# Patient Record
Sex: Male | Born: 1970 | Race: White | Hispanic: No | Marital: Married | Smoking: Current every day smoker
Health system: Southern US, Community
[De-identification: ages and names within clinical notes are randomized; demographics above are authoritative.]

## PROBLEM LIST (undated history)

## (undated) DIAGNOSIS — K219 Gastro-esophageal reflux disease without esophagitis: Secondary | ICD-10-CM

## (undated) DIAGNOSIS — K759 Inflammatory liver disease, unspecified: Secondary | ICD-10-CM

## (undated) DIAGNOSIS — F419 Anxiety disorder, unspecified: Secondary | ICD-10-CM

## (undated) DIAGNOSIS — R768 Other specified abnormal immunological findings in serum: Secondary | ICD-10-CM

## (undated) DIAGNOSIS — F1010 Alcohol abuse, uncomplicated: Secondary | ICD-10-CM

## (undated) DIAGNOSIS — A048 Other specified bacterial intestinal infections: Secondary | ICD-10-CM

## (undated) DIAGNOSIS — I10 Essential (primary) hypertension: Secondary | ICD-10-CM

## (undated) DIAGNOSIS — G4700 Insomnia, unspecified: Secondary | ICD-10-CM

## (undated) DIAGNOSIS — R0602 Shortness of breath: Secondary | ICD-10-CM

## (undated) DIAGNOSIS — R634 Abnormal weight loss: Secondary | ICD-10-CM

## (undated) HISTORY — DX: Anxiety disorder, unspecified: F41.9

## (undated) HISTORY — DX: Gastro-esophageal reflux disease without esophagitis: K21.9

## (undated) HISTORY — PX: TYMPANOSTOMY TUBE PLACEMENT: SHX32

## (undated) HISTORY — DX: Other specified bacterial intestinal infections: A04.8

## (undated) HISTORY — DX: Insomnia, unspecified: G47.00

## (undated) HISTORY — DX: Alcohol abuse, uncomplicated: F10.10

## (undated) HISTORY — DX: Essential (primary) hypertension: I10

## (undated) HISTORY — DX: Other specified abnormal immunological findings in serum: R76.8

---

## 2003-03-07 ENCOUNTER — Emergency Department (HOSPITAL_COMMUNITY): Admission: EM | Admit: 2003-03-07 | Discharge: 2003-03-07 | Payer: Self-pay | Admitting: Emergency Medicine

## 2004-05-22 ENCOUNTER — Emergency Department (HOSPITAL_COMMUNITY): Admission: EM | Admit: 2004-05-22 | Discharge: 2004-05-22 | Payer: Self-pay | Admitting: Emergency Medicine

## 2005-12-06 ENCOUNTER — Emergency Department (HOSPITAL_COMMUNITY): Admission: EM | Admit: 2005-12-06 | Discharge: 2005-12-06 | Payer: Self-pay | Admitting: Emergency Medicine

## 2006-02-07 ENCOUNTER — Emergency Department (HOSPITAL_COMMUNITY): Admission: EM | Admit: 2006-02-07 | Discharge: 2006-02-07 | Payer: Self-pay | Admitting: Emergency Medicine

## 2006-09-25 ENCOUNTER — Emergency Department (HOSPITAL_COMMUNITY): Admission: EM | Admit: 2006-09-25 | Discharge: 2006-09-25 | Payer: Self-pay | Admitting: Emergency Medicine

## 2006-11-29 ENCOUNTER — Ambulatory Visit (HOSPITAL_COMMUNITY): Admission: RE | Admit: 2006-11-29 | Discharge: 2006-11-29 | Payer: Self-pay | Admitting: Family Medicine

## 2008-03-22 ENCOUNTER — Emergency Department (HOSPITAL_COMMUNITY): Admission: EM | Admit: 2008-03-22 | Discharge: 2008-03-22 | Payer: Self-pay | Admitting: Emergency Medicine

## 2008-11-05 IMAGING — CR DG CHEST 1V PORT
1 series · 1 of 1 positions shown · non-contrast
Comparison: 11/29/2006

CLINICAL DATA: Chest pain

PORTABLE CHEST - 1 VIEW

[view not recorded]
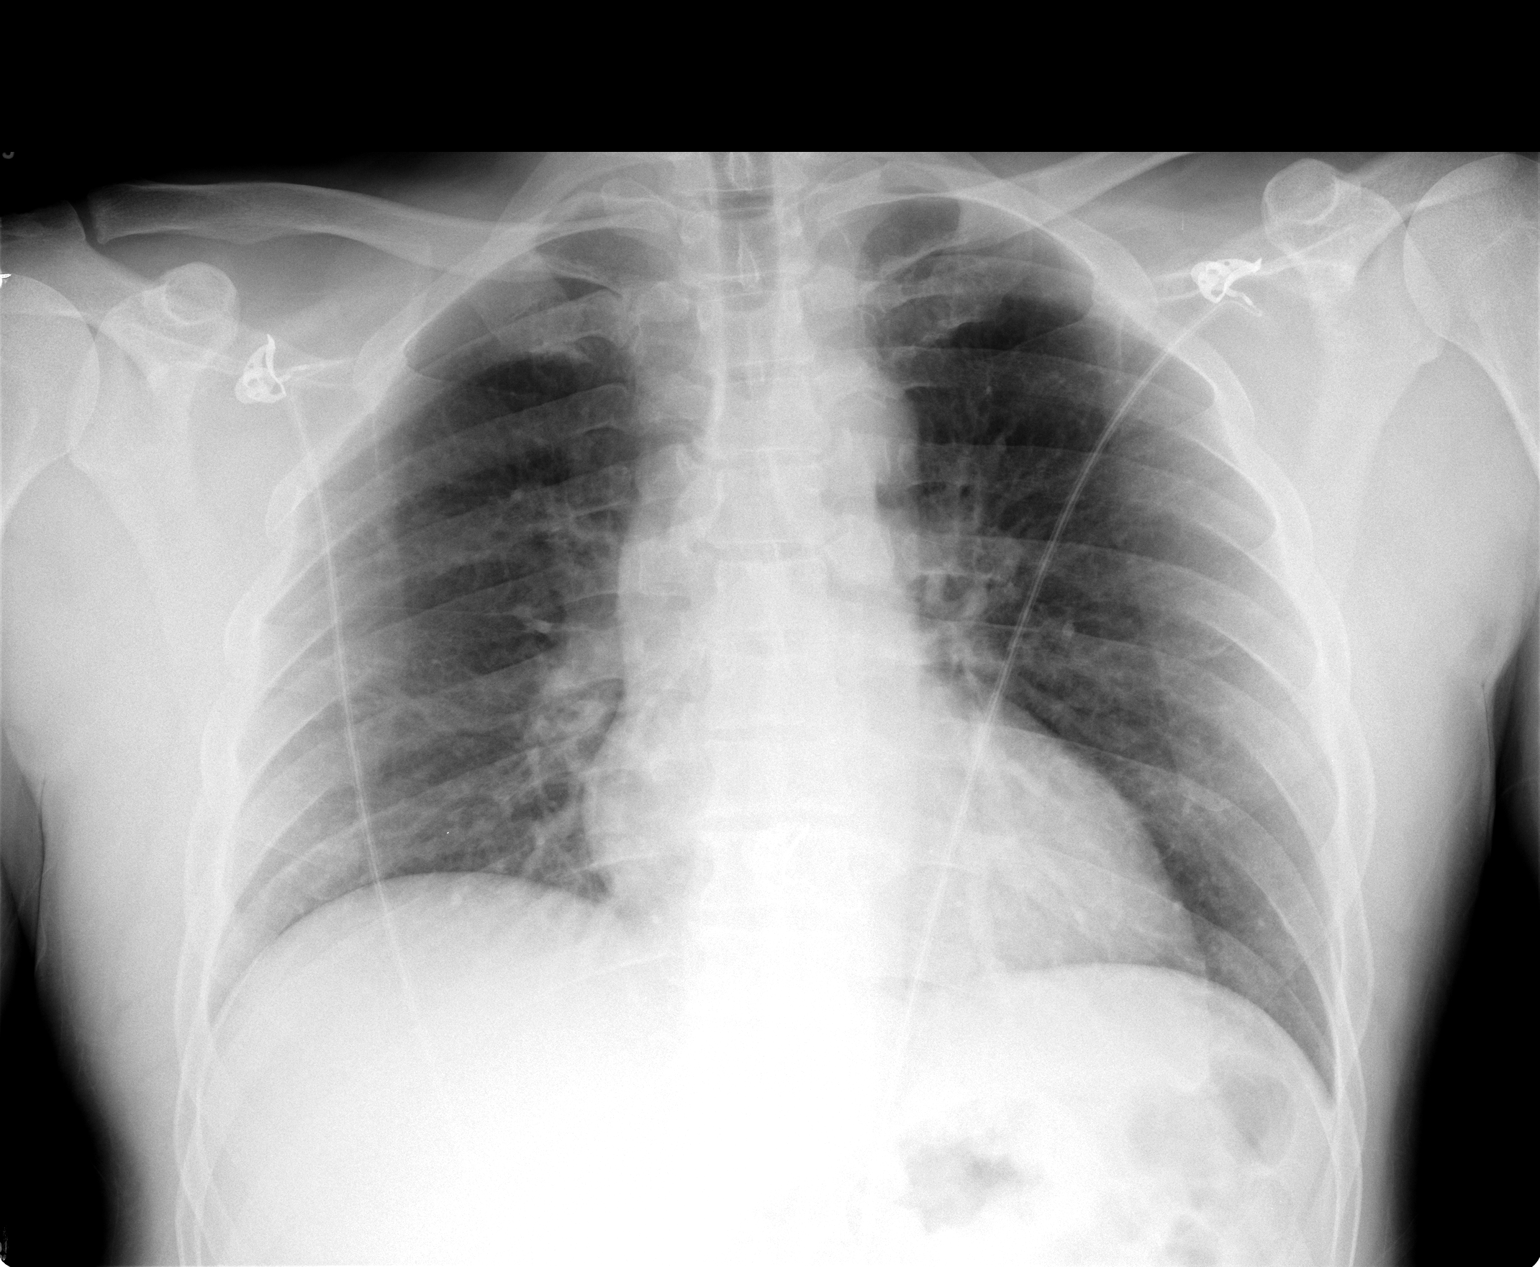

[1 of 1 positions shown; findings below may reference images not displayed]

FINDINGS: Heart size within normal limits for [facility] projection.  Lungs
clear.  No definite heart failure or pleural fluid.
IMPRESSION: No active disease .

## 2011-03-27 NOTE — Consult Note (Signed)
NAMEMarland Kitchen  Jerome Henry, Jerome Henry NO.:  [id number]   MEDICAL RECORD NO.:  [id number]          PATIENT TYPE:  EMS   LOCATION:  ED                            FACILITY:  [facility]   PHYSICIAN:  Donna Bernard, M.D.DATE OF BIRTH:  1971/08/21   DATE OF CONSULTATION:  03/22/2008  DATE OF DISCHARGE:                                 CONSULTATION   CHIEF COMPLAINT:  Chest pain.   HISTORY OF PRESENT ILLNESS:  This patient is a 40 year old male with a  history of hypertension and noncompliance with his meds because of  perceived side effects along with a pack-a-day smoking history who  arrived to the emergency room on the day of admission with high blood  pressure and chest pain; chest pain sharp, worse with certain emotions,  worse with deep breath, transient, hits for just a few seconds.  Blood  pressures have been up.  The patient has received a couple doses of IV  morphine.  He has received IV morphine for this pain and  antihypertensives have been started in the ER.   PHYSICAL EXAMINATION:  VITAL SIGNS: BP 160/90.  LUNGS: Clear.  HEENT: Normal.  ABDOMEN: Soft.  CHEST: Some chest wall tenderness to palpation.   Chest x-ray negative.  EKG negative.  Telemetry negative.  Cardiac  enzymes negative.   IMPRESSION:  1. Chest pain, I doubt cardiac in origin.  2. Hypertension, poor control.   PLAN:  1. Stop other blood pressure meds.  2. Stop smoking.  3. Initiate Norvasc 20 mg daily.  4. Analgesics prescribed for pain, expect gradual resolution, add      antiinflammatories, and also follow up in the office.      Donna Bernard, M.D.  Electronically Signed     WSL/MEDQ  D:  03/24/2008  T:  03/25/2008  Job:  [id number]

## 2013-08-11 ENCOUNTER — Ambulatory Visit (INDEPENDENT_AMBULATORY_CARE_PROVIDER_SITE_OTHER): Payer: Self-pay | Admitting: Family Medicine

## 2013-08-11 ENCOUNTER — Encounter: Payer: Self-pay | Admitting: Family Medicine

## 2013-08-11 VITALS — BP 142/88 | Ht 73.0 in | Wt 190.0 lb

## 2013-08-11 DIAGNOSIS — F1010 Alcohol abuse, uncomplicated: Secondary | ICD-10-CM

## 2013-08-11 DIAGNOSIS — F411 Generalized anxiety disorder: Secondary | ICD-10-CM

## 2013-08-11 DIAGNOSIS — I10 Essential (primary) hypertension: Secondary | ICD-10-CM

## 2013-08-11 MED ORDER — TRIAMTERENE-HCTZ 37.5-25 MG PO CAPS: 1.0000 | ORAL_CAPSULE | ORAL | Status: DC

## 2013-08-11 MED ORDER — ALPRAZOLAM 1 MG PO TABS: ORAL_TABLET | ORAL | Status: DC

## 2013-08-11 NOTE — Progress Notes (Signed)
  Subjective:    Patient ID: Jerome Henry, male    DOB: [birth year], 42 y.o.   MRN: [id number]  HPIHere for a blood pressure check.   Stopped drinking heavily recently.  Positive pain and tend for elbow.  Right elbow pain for several months. Pain getting worse. Taking tylenol.  Patient states he has a tremendous problem with anxiety. Often finds himself drinking alcohol to handle his anxiety. Very resistant to alcohol counseling at this time. Has been through multiple counseling sessions and multiple hospitalizations all without success. Has run out of his blood pressure medicine and realizes he needs to take more. The beta blocker however made him feel bad.    Review of Systems No headache no chest pain no abdominal pain ROS otherwise negative    Objective:   Physical Exam Alert no acute distress HEENT normal. Lungs clear. Heart regular in rhythm. Right elbow tenderness palpation. Blood pressure repeat 146/94.       Assessment & Plan:  Impression hypertension suboptimal #2 chronic significant anxiety severe per patient. #3 right or Prandin bursitis discussed plan Dyazide one each morning rationale discussed. He past lamina 1 mg #60 one half to one twice a day drowsy precautions to not use with alcohol 5 refills check every 6 months diet exercise discussed patient declines counseling. WSL

## 2013-08-12 DIAGNOSIS — F411 Generalized anxiety disorder: Secondary | ICD-10-CM | POA: Insufficient documentation

## 2013-08-12 DIAGNOSIS — I10 Essential (primary) hypertension: Secondary | ICD-10-CM | POA: Insufficient documentation

## 2013-08-12 DIAGNOSIS — F1010 Alcohol abuse, uncomplicated: Secondary | ICD-10-CM | POA: Insufficient documentation

## 2013-12-28 ENCOUNTER — Ambulatory Visit (INDEPENDENT_AMBULATORY_CARE_PROVIDER_SITE_OTHER): Payer: Self-pay | Admitting: Nurse Practitioner

## 2013-12-28 ENCOUNTER — Encounter: Payer: Self-pay | Admitting: Nurse Practitioner

## 2013-12-28 VITALS — BP 138/96 | Ht 73.0 in | Wt 199.0 lb

## 2013-12-28 DIAGNOSIS — K219 Gastro-esophageal reflux disease without esophagitis: Secondary | ICD-10-CM

## 2013-12-28 DIAGNOSIS — S2249XA Multiple fractures of ribs, unspecified side, initial encounter for closed fracture: Secondary | ICD-10-CM

## 2013-12-28 MED ORDER — HYDROCODONE-ACETAMINOPHEN 7.5-325 MG PO TABS: 1.0000 | ORAL_TABLET | ORAL | Status: DC | PRN

## 2013-12-28 MED ORDER — OMEPRAZOLE 20 MG PO CPDR: 20.0000 mg | DELAYED_RELEASE_CAPSULE | Freq: Every day | ORAL | Status: AC

## 2013-12-28 NOTE — Patient Instructions (Signed)

## 2013-12-31 ENCOUNTER — Encounter: Payer: Self-pay | Admitting: Gastroenterology

## 2013-12-31 ENCOUNTER — Encounter: Payer: Self-pay | Admitting: Nurse Practitioner

## 2013-12-31 DIAGNOSIS — K219 Gastro-esophageal reflux disease without esophagitis: Secondary | ICD-10-CM | POA: Insufficient documentation
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
## 2014-01-02 ENCOUNTER — Encounter: Payer: Self-pay | Admitting: *Deleted

## 2014-01-19 ENCOUNTER — Ambulatory Visit: Payer: Self-pay | Admitting: Gastroenterology

## 2014-01-19 ENCOUNTER — Telehealth: Payer: Self-pay | Admitting: Gastroenterology

## 2014-01-19 NOTE — Telephone Encounter (Signed)
Pt was a no show

## 2014-01-19 NOTE — Telephone Encounter (Signed)
Please send letter.

## 2014-01-25 ENCOUNTER — Encounter: Payer: Self-pay | Admitting: Gastroenterology

## 2014-01-25 NOTE — Telephone Encounter (Signed)
MAILED LETTER °

## 2014-02-04 ENCOUNTER — Ambulatory Visit: Payer: BC Managed Care – PPO | Admitting: Family Medicine

## 2014-02-24 ENCOUNTER — Ambulatory Visit (INDEPENDENT_AMBULATORY_CARE_PROVIDER_SITE_OTHER): Payer: BC Managed Care – PPO | Admitting: Gastroenterology

## 2014-02-24 ENCOUNTER — Telehealth: Payer: Self-pay | Admitting: Gastroenterology

## 2014-02-24 ENCOUNTER — Encounter: Payer: Self-pay | Admitting: Gastroenterology

## 2014-02-24 ENCOUNTER — Other Ambulatory Visit: Payer: Self-pay | Admitting: Internal Medicine

## 2014-02-24 VITALS — BP 140/88 | HR 87 | Temp 98.4°F | Ht 73.0 in | Wt 187.0 lb

## 2014-02-24 DIAGNOSIS — F1010 Alcohol abuse, uncomplicated: Secondary | ICD-10-CM

## 2014-02-24 DIAGNOSIS — K760 Fatty (change of) liver, not elsewhere classified: Secondary | ICD-10-CM

## 2014-02-24 DIAGNOSIS — R7689 Other specified abnormal immunological findings in serum: Secondary | ICD-10-CM

## 2014-02-24 DIAGNOSIS — R112 Nausea with vomiting, unspecified: Secondary | ICD-10-CM

## 2014-02-24 DIAGNOSIS — R634 Abnormal weight loss: Secondary | ICD-10-CM

## 2014-02-24 DIAGNOSIS — R894 Abnormal immunological findings in specimens from other organs, systems and tissues: Secondary | ICD-10-CM

## 2014-02-24 DIAGNOSIS — R6889 Other general symptoms and signs: Secondary | ICD-10-CM

## 2014-02-24 DIAGNOSIS — K7689 Other specified diseases of liver: Secondary | ICD-10-CM

## 2014-02-24 DIAGNOSIS — K219 Gastro-esophageal reflux disease without esophagitis: Secondary | ICD-10-CM

## 2014-02-24 DIAGNOSIS — R0989 Other specified symptoms and signs involving the circulatory and respiratory systems: Secondary | ICD-10-CM

## 2014-02-24 DIAGNOSIS — R768 Other specified abnormal immunological findings in serum: Secondary | ICD-10-CM

## 2014-02-24 NOTE — Assessment & Plan Note (Addendum)
43 year old gentleman with history of chronic GERD  which has never been evaluated who presents with multiple GI complaints. Complains of waking up every morning with vomiting. Frequent heartburn, lack of appetite in the setting of daily Goody's powders. He reports 25 pound weight loss in the past 3-4 months. Documented 12 pound weight loss at least since February. Significant history of alcohol abuse daily. Patient is at increased risk of peptic ulcer disease, complicated GERD, malignancy. Recently had noncontrast CT scan which showed fatty liver and rib fractures as noted above. At this point would offer an upper endoscopy with deep sedation in the OR given alcohol abuse/anxiety/polypharmacy.  I have discussed the risks, alternatives, benefits with regards to but not limited to the risk of reaction to medication, bleeding, infection, perforation and the patient is agreeable to proceed. Written consent to be obtained.  Would recommend daily omeprazole. Stop aspirin powders. Dramatically decrease alcohol consumption. Further recommendations to follow. Out of work note provided for today.
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
## 2014-02-24 NOTE — Telephone Encounter (Signed)
EGD w/Propofol w/RMR is scheduled on Thursday May 7th and I have spoken to his wife and they are aware, I have mailed him instructions

## 2014-02-24 NOTE — Assessment & Plan Note (Signed)
Patient has several episodes of significant choking which occurs while awake and nocturnally. Never related to a meal. Describes episodes that last one to 2 minutes, becomes blue in the face, can exhale but not inhale. Question spasm, aspiration. Discussed EGD and initially to make sure no esophageal abnormalities. May need to have speech therapy evaluation or ENT evaluation.

## 2014-02-24 NOTE — Assessment & Plan Note (Signed)
Noted on recent CT. Patient is interesting history of possible chronic hepatitis C detected 14 years ago. Possibly false positive antibody as patient reports being told later that he did not have hepatitis C by the health department. He demonstrates history of high-risk behavior. Will recheck at this time. Also check LFTs. Recommend significantly decreasing alcohol use with goal of quitting.

## 2014-02-24 NOTE — Telephone Encounter (Signed)
Message copied by Ishmael HolterLOVELACE, Brodrick Curran A on Wed Feb 24, 2014  2:17 PM ------      Message from: Tiffany KocherLEWIS, LESLIE S      Created: Wed Feb 24, 2014  1:49 PM       Please let patient know instructions.      Needs labs.      Please schedule EGD in the OR with Dr. Jena Gaussourk, see office note.      Decrease alcohol consumption      Stop aspirin powder      Take omeprazole daily.            These instructions are noted in the AVS, see me if any questions. ------

## 2014-02-24 NOTE — Patient Instructions (Signed)
1. Please have your lab work done. 2. Upper endoscopy, see separate instructions. 3. Take omeprazole every day before breakfast. 4. Stop aspirin powders. 5. You need to drop back significantly on your alcohol consumption. If you need any assistance please let us know.

## 2014-02-25 ENCOUNTER — Ambulatory Visit: Payer: Self-pay | Admitting: Gastroenterology

## 2014-02-25 ENCOUNTER — Other Ambulatory Visit: Payer: Self-pay | Admitting: Family Medicine

## 2014-02-25 LAB — COMPREHENSIVE METABOLIC PANEL
ALBUMIN: 4.3 g/dL (ref 3.5–5.2)
ALT: 45 U/L (ref 0–53)
AST: 60 U/L — ABNORMAL HIGH (ref 0–37)
Alkaline Phosphatase: 74 U/L (ref 39–117)
BILIRUBIN TOTAL: 0.8 mg/dL (ref 0.2–1.2)
BUN: 8 mg/dL (ref 6–23)
CALCIUM: 9.4 mg/dL (ref 8.4–10.5)
CHLORIDE: 102 meq/L (ref 96–112)
CO2: 27 meq/L (ref 19–32)
Creat: 0.85 mg/dL (ref 0.50–1.35)
Glucose, Bld: 110 mg/dL — ABNORMAL HIGH (ref 70–99)
POTASSIUM: 3.9 meq/L (ref 3.5–5.3)
Sodium: 137 mEq/L (ref 135–145)
TOTAL PROTEIN: 7.3 g/dL (ref 6.0–8.3)

## 2014-02-25 LAB — CBC WITH DIFFERENTIAL/PLATELET
BASOS PCT: 0 % (ref 0–1)
Basophils Absolute: 0 10*3/uL (ref 0.0–0.1)
EOS PCT: 1 % (ref 0–5)
Eosinophils Absolute: 0.1 10*3/uL (ref 0.0–0.7)
HCT: 43.3 % (ref 39.0–52.0)
HEMOGLOBIN: 15.8 g/dL (ref 13.0–17.0)
LYMPHS PCT: 35 % (ref 12–46)
Lymphs Abs: 1.9 10*3/uL (ref 0.7–4.0)
MCH: 34.1 pg — AB (ref 26.0–34.0)
MCHC: 36.5 g/dL — ABNORMAL HIGH (ref 30.0–36.0)
MCV: 93.5 fL (ref 78.0–100.0)
MONO ABS: 0.7 10*3/uL (ref 0.1–1.0)
MONOS PCT: 13 % — AB (ref 3–12)
NEUTROS ABS: 2.7 10*3/uL (ref 1.7–7.7)
Neutrophils Relative %: 51 % (ref 43–77)
Platelets: 119 10*3/uL — ABNORMAL LOW (ref 150–400)
RBC: 4.63 MIL/uL (ref 4.22–5.81)
RDW: 13.3 % (ref 11.5–15.5)
WBC: 5.3 10*3/uL (ref 4.0–10.5)

## 2014-02-25 LAB — LIPASE: LIPASE: 25 U/L (ref 0–75)

## 2014-02-25 NOTE — Telephone Encounter (Signed)
It is best for this medication to be used sparingly. I would recommend this be refilled for 20 tablets with no refills. The prescription should read half to a whole tablet twice daily as necessary for severe anxiety. Use sparingly. Not meant for frequent use.

## 2014-02-25 NOTE — Progress Notes (Signed)
cc'd to pcp 

## 2014-03-01 LAB — HCV RNA QUANT RFLX ULTRA OR GENOTYP
HCV Quantitative Log: 5.24 {Log} — ABNORMAL HIGH (ref <1.18)
HCV Quantitative: 174272 IU/mL — ABNORMAL HIGH (ref <15)

## 2014-03-03 ENCOUNTER — Encounter (HOSPITAL_COMMUNITY): Payer: Self-pay

## 2014-03-03 LAB — HEPATITIS C GENOTYPE: HCV Genotype: 1

## 2014-03-08 ENCOUNTER — Ambulatory Visit (INDEPENDENT_AMBULATORY_CARE_PROVIDER_SITE_OTHER): Payer: BC Managed Care – PPO | Admitting: Family Medicine

## 2014-03-08 ENCOUNTER — Telehealth: Payer: Self-pay | Admitting: Family Medicine

## 2014-03-08 ENCOUNTER — Encounter: Payer: Self-pay | Admitting: Family Medicine

## 2014-03-08 VITALS — BP 140/90 | Ht 72.0 in | Wt 193.2 lb

## 2014-03-08 DIAGNOSIS — B1920 Unspecified viral hepatitis C without hepatic coma: Secondary | ICD-10-CM

## 2014-03-08 DIAGNOSIS — Z0189 Encounter for other specified special examinations: Secondary | ICD-10-CM

## 2014-03-08 DIAGNOSIS — B182 Chronic viral hepatitis C: Secondary | ICD-10-CM | POA: Insufficient documentation

## 2014-03-08 MED ORDER — ALPRAZOLAM 1 MG PO TABS: ORAL_TABLET | ORAL | Status: DC

## 2014-03-08 NOTE — Patient Instructions (Signed)
Hepatitis C °Hepatitis C is a viral infection of the liver. Infection may go undetected for months or years because symptoms may be absent or very mild. Chronic liver disease is the main danger of hepatitis C. This may lead to scarring of the liver (cirrhosis), liver failure, and liver cancer. °CAUSES  °Hepatitis C is caused by the hepatitis C virus (HCV). Formerly, hepatitis C infections were most commonly transmitted through blood transfusions. In the early 1990s, routine testing of donated blood for hepatitis C and exclusion of blood that tests positive for HCV began. Now, HCV is most commonly transmitted from person to person through injection drug use, sharing needles, or sex with an infected person. A caregiver may also get the infection from exposure to the blood of an infected patient by way of a cut or needle stick.  °SYMPTOMS  °Acute Phase °Many cases of acute HCV infection are mild and cause few problems. Some people may not even realize they are sick. Symptoms in others may last a few weeks to several months and include: °· Feeling very tired. °· Loss of appetite. °· Nausea. °· Vomiting. °· Abdominal pain. °· Dark yellow urine. °· Yellow skin and eyes (jaundice). °· Itching of the skin. °Chronic Phase °· Between 50% to 85% of people who get HCV infection become "chronic carriers." They often have no symptoms, but the virus stays in their body. They may spread the virus to others and can get long-term liver disease. °· Many people with chronic HCV infection remain healthy for many years. However, up to 1 in 5 chronically infected people may develop severe liver diseases including scarring of the liver (cirrhosis), liver failure, or liver cancer. °DIAGNOSIS  °Diagnosis of hepatitis C infection is made by testing blood for the presence of hepatitis C viral particles called RNA. Other tests may also be done to measure the status of current liver function, exclude other liver problems, or assess liver  damage. °TREATMENT  °Treatment with many antiviral drugs is available and recommended for some patients with chronic HCV infection. Drug treatment is generally considered appropriate for patients who: °· Are 18 years of age or older. °· Have a positive test for HCV particles in the blood. °· Have a liver tissue sample (biopsy) that shows chronic hepatitis and significant scarring (fibrosis). °· Do not have signs of liver failure. °· Have acceptable blood test results that confirm the wellness of other body organs. °· Are willing to be treated and conform to treatment requirements. °· Have no other circumstances that would prevent treatment from being recommended (contraindications). °All people who are offered and choose to receive drug treatment must understand that careful medical follow up for many months and even years is crucial in order to make successful care possible. The goal of drug treatment is to eliminate any evidence of HCV in the blood on a long-term basis. This is called a "sustained virologic response" or SVR. Achieving a SVR is associated with a decrease in the chance of life-threatening liver problems, need for a liver transplant, liver cancer rates, and liver-related complications. °Successful treatment currently requires taking treatment drugs for at least 24 weeks and up to 72 weeks. An injected drug (interferon) given weekly and an oral antiviral medicine taken daily are usually prescribed. Side effects from these drugs are common and some may be very serious. Your response to treatment must be carefully monitored by both you and your caregiver throughout the entire treatment period. °PREVENTION °There is no vaccine for hepatitis C. The only   way to prevent the disease is to reduce the risk of exposure to the virus.  °· Avoid sharing drug needles or personal items like toothbrushes, razors, and nail clippers with an infected person. °· Healthcare workers need to avoid injuries and wear  appropriate protective equipment such as gloves, gowns, and face masks when performing invasive medical or nursing procedures. °HOME CARE INSTRUCTIONS  °To avoid making your liver disease worse: °· Strictly avoid drinking alcohol. °· Carefully review all new prescriptions of medicines with your caregiver. Ask your caregiver which drugs you should avoid. The following drugs are toxic to the liver, and your caregiver may tell you to avoid them: °· Isoniazid. °· Methyldopa. °· Acetaminophen. °· Anabolic steroids (muscle-building drugs). °· Erythromycin. °· Oral contraceptives (birth control pills). °· Check with your caregiver to make sure medicine you are currently taking will not be harmful. °· Periodic blood tests may be required. Follow your caregiver's advice about when you should have blood tests. °· Avoid a sexual relationship until advised otherwise by your caregiver. °· Avoid activities that could expose other people to your blood. Examples include sharing a toothbrush, nail clippers, razors, and needles. °· Bed rest is not necessary, but it may make you feel better. Recovery time is not related to the amount of rest you receive. °· This infection is contagious. Follow your caregiver's instructions in order to avoid spread of the infection. °SEEK IMMEDIATE MEDICAL CARE IF: °· You have increasing fatigue or weakness. °· You have an oral temperature above 102° F (38.9° C), not controlled by medicine. °· You develop loss of appetite, nausea, or vomiting. °· You develop jaundice. °· You develop easy bruising or bleeding. °· You develop any severe problems as a result of your treatment. °MAKE SURE YOU:  °· Understand these instructions. °· Will watch your condition. °· Will get help right away if you are not doing well or get worse. °Document Released: 10/26/2000 Document Revised: 01/21/2012 Document Reviewed: 02/28/2011 °Exit[employer]® Patient Information ©2014 [employer]. ° °
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
## 2014-03-08 NOTE — Telephone Encounter (Signed)
No, only sexual partners

## 2014-03-08 NOTE — Telephone Encounter (Signed)
Does her son need to be tested for this now?

## 2014-03-09 LAB — LIPID PANEL
CHOL/HDL RATIO: 3.2 ratio
CHOLESTEROL: 174 mg/dL (ref 0–200)
HDL: 54 mg/dL (ref >39)
LDL Cholesterol: 100 mg/dL — ABNORMAL HIGH (ref 0–99)
Triglycerides: 102 mg/dL (ref <150)
VLDL: 20 mg/dL (ref 0–40)

## 2014-03-10 ENCOUNTER — Telehealth: Payer: Self-pay | Admitting: *Deleted

## 2014-03-10 NOTE — Progress Notes (Signed)
Quick Note:  Mild elevation of AST. Platelet count slightly low. Labs show active Hepatitis C.  His last liver imaging was noncontrast, which limits study but no obvious tumor or cirrhosis.   Proceed with EGD. Based on findings, we will determine what further liver imaging he will need. We can also discuss possibility of HCV treatment at upcoming OV. Schedule a follow up OV in 6 weeks. ______

## 2014-03-10 NOTE — Telephone Encounter (Signed)
I spoke with Jerome Henry. Advised her that I would like to speak with patient regarding results. They are already aware of HCV, heard from PCP.   Patient to call me back 03/11/14 during lunch time.

## 2014-03-10 NOTE — Patient Instructions (Signed)
Jerome Henry  03/10/2014   Your procedure is scheduled on:  Thursday, Mar 18, 2014  [facility] at [facility] AM.  Call this number if you have problems the morning of surgery: [phone]   Remember:   Do not eat food or drink liquids after midnight.   Take these medicines the morning of surgery with A SIP OF WATER: xanax and prilosec   Do not wear jewelry, make-up or nail polish.  Do not wear lotions, powders, or perfumes. You may wear deodorant.  Do not shave 48 hours prior to surgery. Men may shave face and neck.  Do not bring valuables to the hospital.  [facility] not responsible                  for any belongings or valuables.               Contacts, dentures or bridgework may not be worn into surgery.  Leave suitcase in the car. After surgery it may be brought to your room.  For patients admitted to the hospital, discharge time is determined by your                treatment team.               Patients discharged the day of surgery will not be allowed to drive  home.  Name and phone number of your driver: family  Special Instructions: Please follow diet instructions given to you by DR. Rourk's office   Please read over the following fact sheets that you were given: Anesthesia Post-op Instructions and Care and Recovery After Surgery   PATIENT INSTRUCTIONS POST-ANESTHESIA  IMMEDIATELY FOLLOWING SURGERY:  Do not drive or operate machinery for the first twenty four hours after surgery.  Do not make any important decisions for twenty four hours after surgery or while taking narcotic pain medications or sedatives.  If you develop intractable nausea and vomiting or a severe headache please notify your doctor immediately.  FOLLOW-UP:  Please make an appointment with your surgeon as instructed. You do not need to follow up with anesthesia unless specifically instructed to do so.  WOUND CARE INSTRUCTIONS (if applicable):  Keep a dry clean dressing on the anesthesia/puncture  wound site if there is drainage.  Once the wound has quit draining you may leave it open to air.  Generally you should leave the bandage intact for twenty four hours unless there is drainage.  If the epidural site drains for more than 36-48 hours please call the anesthesia department.  QUESTIONS?:  Please feel free to call your physician or the hospital operator if you have any questions, and they will be happy to assist you.

## 2014-03-10 NOTE — Telephone Encounter (Signed)
Lab results are in epic. Routing to LSL for results.

## 2014-03-10 NOTE — Telephone Encounter (Signed)
Pt's other Jerome Henry called for pt stated pt wants to know about blood work because Dr. Gerda DissLuking does not have pt's blood work by Dr. Jena Gaussourk, pt could not have full physical because of it. Please advise [phone]

## 2014-03-10 NOTE — Telephone Encounter (Signed)
Jerome Henry was returning a call for pt. Please advise [phone]

## 2014-03-10 NOTE — Telephone Encounter (Signed)
Tried to call patient. A lady answered and said he was not available right now. I asked her to have him call us and she hung up the phone.   Please try to reach patient. I would advise to give this information to patient only.

## 2014-03-11 ENCOUNTER — Encounter (HOSPITAL_COMMUNITY): Payer: Self-pay

## 2014-03-11 ENCOUNTER — Encounter (HOSPITAL_COMMUNITY)
Admission: RE | Admit: 2014-03-11 | Discharge: 2014-03-11 | Disposition: A | Discharge status: Home / Self Care | Payer: BC Managed Care – PPO | Source: Ambulatory Visit | Attending: Internal Medicine | Admitting: Internal Medicine

## 2014-03-11 ENCOUNTER — Other Ambulatory Visit: Payer: Self-pay

## 2014-03-11 DIAGNOSIS — Z01810 Encounter for preprocedural cardiovascular examination: Secondary | ICD-10-CM | POA: Insufficient documentation

## 2014-03-11 HISTORY — DX: Abnormal weight loss: R63.4

## 2014-03-11 HISTORY — DX: Inflammatory liver disease, unspecified: K75.9

## 2014-03-11 HISTORY — DX: Shortness of breath: R06.02

## 2014-03-11 NOTE — Telephone Encounter (Signed)
Patient did not call back today. See my result note.    Proceed with EGD. Based on findings, we will determine what further liver imaging he will need. We can also discuss possibility of HCV treatment at upcoming OV. Schedule a follow up OV in 6 weeks.

## 2014-03-11 NOTE — Progress Notes (Signed)
03/11/14 1252  OBSTRUCTIVE SLEEP APNEA  Have you ever been diagnosed with sleep apnea through a sleep study? No  Do you snore loudly (loud enough to be heard through closed doors)?  1  Do you often feel tired, fatigued, or sleepy during the daytime? 1  Has anyone observed you stop breathing during your sleep? 1  Do you have, or are you being treated for high blood pressure? 1  BMI more than 35 kg/m2? 0  Age over 45 years old? 0  Neck circumference greater than 40 cm/16 inches? 0  Gender: 1  Obstructive Sleep Apnea Score 5  Score 4 or greater  Results sent to PCP

## 2014-03-11 NOTE — Pre-Procedure Instructions (Signed)
Patient given information to sign up for my chart at home. 

## 2014-03-11 NOTE — Telephone Encounter (Signed)
See other phone note

## 2014-03-12 DIAGNOSIS — A048 Other specified bacterial intestinal infections: Secondary | ICD-10-CM

## 2014-03-12 HISTORY — DX: Other specified bacterial intestinal infections: A04.8

## 2014-03-15 ENCOUNTER — Encounter: Payer: Self-pay | Admitting: Family Medicine

## 2014-03-15 ENCOUNTER — Ambulatory Visit (INDEPENDENT_AMBULATORY_CARE_PROVIDER_SITE_OTHER): Payer: BC Managed Care – PPO | Admitting: Family Medicine

## 2014-03-15 VITALS — BP 140/90 | Ht 72.0 in | Wt 193.0 lb

## 2014-03-15 DIAGNOSIS — M719 Bursopathy, unspecified: Secondary | ICD-10-CM

## 2014-03-15 DIAGNOSIS — M7150 Other bursitis, not elsewhere classified, unspecified site: Secondary | ICD-10-CM

## 2014-03-15 NOTE — Telephone Encounter (Signed)
Pt is scheduled for EGD on 03/18/14 with RMR.  Darl PikesSusan, please schedule follow up visit

## 2014-03-15 NOTE — Progress Notes (Signed)
   Subjective:    Patient ID: Jerome Henry, male    DOB: 05-31-1971, 43 y.o.   MRN: [id number]  HPI  Patient arrives office with left elbow pain. Significant swelling. Very large amount of fluid on yellow. Had this on the right elbow. It had to be drained. Patient once this elbow drained.  Review of Systems    no pain elsewhere no chest pain or shortness of breath Objective:   Physical Exam  Alert no acute distress. Lungs clear. Heart rate and rhythm. Left elbow very impressive swelling. Ex  Patient was prepped draped anesthetized 10 cc of serosanguineous fluid were withdrawn. 1 cc of Depo-Medrol and Xylocaine 1-1 mix given.      Assessment & Plan:  Impression 1 olecranon bursitis injection plan wound care discussed.

## 2014-03-18 ENCOUNTER — Ambulatory Visit (HOSPITAL_COMMUNITY)
Admission: RE | Admit: 2014-03-18 | Discharge: 2014-03-18 | Disposition: A | Discharge status: Home / Self Care | Payer: BC Managed Care – PPO | Source: Ambulatory Visit | Attending: Internal Medicine | Admitting: Internal Medicine

## 2014-03-18 ENCOUNTER — Encounter (HOSPITAL_COMMUNITY): Payer: Self-pay

## 2014-03-18 ENCOUNTER — Ambulatory Visit (HOSPITAL_COMMUNITY): Payer: BC Managed Care – PPO | Admitting: Anesthesiology

## 2014-03-18 ENCOUNTER — Encounter (HOSPITAL_COMMUNITY)
Admission: RE | Disposition: A | Discharge status: Home / Self Care | Payer: Self-pay | Source: Ambulatory Visit | Attending: Internal Medicine

## 2014-03-18 ENCOUNTER — Encounter (HOSPITAL_COMMUNITY): Payer: BC Managed Care – PPO | Admitting: Anesthesiology

## 2014-03-18 ENCOUNTER — Encounter: Payer: Self-pay | Admitting: Gastroenterology

## 2014-03-18 DIAGNOSIS — A048 Other specified bacterial intestinal infections: Secondary | ICD-10-CM | POA: Insufficient documentation

## 2014-03-18 DIAGNOSIS — R1319 Other dysphagia: Secondary | ICD-10-CM

## 2014-03-18 DIAGNOSIS — K2990 Gastroduodenitis, unspecified, without bleeding: Secondary | ICD-10-CM

## 2014-03-18 DIAGNOSIS — K2270 Barrett's esophagus without dysplasia: Secondary | ICD-10-CM | POA: Insufficient documentation

## 2014-03-18 DIAGNOSIS — K2970 Gastritis, unspecified, without bleeding: Secondary | ICD-10-CM

## 2014-03-18 DIAGNOSIS — R112 Nausea with vomiting, unspecified: Secondary | ICD-10-CM | POA: Insufficient documentation

## 2014-03-18 DIAGNOSIS — R1310 Dysphagia, unspecified: Secondary | ICD-10-CM | POA: Insufficient documentation

## 2014-03-18 DIAGNOSIS — K219 Gastro-esophageal reflux disease without esophagitis: Secondary | ICD-10-CM | POA: Insufficient documentation

## 2014-03-18 DIAGNOSIS — B1920 Unspecified viral hepatitis C without hepatic coma: Secondary | ICD-10-CM | POA: Insufficient documentation

## 2014-03-18 DIAGNOSIS — R634 Abnormal weight loss: Secondary | ICD-10-CM | POA: Insufficient documentation

## 2014-03-18 DIAGNOSIS — K2940 Chronic atrophic gastritis without bleeding: Secondary | ICD-10-CM | POA: Insufficient documentation

## 2014-03-18 DIAGNOSIS — K229 Disease of esophagus, unspecified: Secondary | ICD-10-CM

## 2014-03-18 HISTORY — PX: MALONEY DILATION: SHX5535

## 2014-03-18 HISTORY — PX: BIOPSY: SHX5522

## 2014-03-18 HISTORY — PX: ESOPHAGOGASTRODUODENOSCOPY (EGD) WITH PROPOFOL: SHX5813

## 2014-03-18 SURGERY — ESOPHAGOGASTRODUODENOSCOPY (EGD) WITH PROPOFOL
Anesthesia: Monitor Anesthesia Care

## 2014-03-18 MED ORDER — FENTANYL CITRATE 0.05 MG/ML IJ SOLN
INTRAMUSCULAR | Status: AC
  Filled 2014-03-18: qty 2

## 2014-03-18 MED ORDER — LIDOCAINE VISCOUS 2 % MT SOLN
OROMUCOSAL | Status: DC | PRN
  Administered 2014-03-18: 1 via OROMUCOSAL

## 2014-03-18 MED ORDER — PROPOFOL 10 MG/ML IV BOLUS
INTRAVENOUS | Status: AC
  Filled 2014-03-18: qty 20

## 2014-03-18 MED ORDER — LIDOCAINE HCL (CARDIAC) 10 MG/ML IV SOLN
INTRAVENOUS | Status: DC | PRN
  Administered 2014-03-18: 30 mg via INTRAVENOUS

## 2014-03-18 MED ORDER — ONDANSETRON HCL 4 MG/2ML IJ SOLN
4.0000 mg | Freq: Once | INTRAMUSCULAR | Status: AC
  Administered 2014-03-18: 4 mg via INTRAVENOUS

## 2014-03-18 MED ORDER — FENTANYL CITRATE 0.05 MG/ML IJ SOLN
INTRAMUSCULAR | Status: DC | PRN
  Administered 2014-03-18 (×2): 25 ug via INTRAVENOUS

## 2014-03-18 MED ORDER — GLYCOPYRROLATE 0.2 MG/ML IJ SOLN
INTRAMUSCULAR | Status: AC
  Filled 2014-03-18: qty 1

## 2014-03-18 MED ORDER — MIDAZOLAM HCL 2 MG/2ML IJ SOLN
1.0000 mg | INTRAMUSCULAR | Status: AC | PRN
  Administered 2014-03-18 (×3): 2 mg via INTRAVENOUS
  Filled 2014-03-18: qty 2

## 2014-03-18 MED ORDER — LIDOCAINE HCL (PF) 1 % IJ SOLN
INTRAMUSCULAR | Status: AC
  Filled 2014-03-18: qty 5

## 2014-03-18 MED ORDER — LACTATED RINGERS IV SOLN
INTRAVENOUS | Status: DC
  Administered 2014-03-18: 08:00:00 via INTRAVENOUS

## 2014-03-18 MED ORDER — MIDAZOLAM HCL 2 MG/2ML IJ SOLN
INTRAMUSCULAR | Status: AC
  Filled 2014-03-18: qty 2

## 2014-03-18 MED ORDER — STERILE WATER FOR IRRIGATION IR SOLN
Status: DC | PRN
  Administered 2014-03-18: 09:00:00

## 2014-03-18 MED ORDER — PROPOFOL INFUSION 10 MG/ML OPTIME
INTRAVENOUS | Status: DC | PRN
  Administered 2014-03-18: 50 ug/kg/min via INTRAVENOUS
  Administered 2014-03-18: 200 ug/kg/min via INTRAVENOUS

## 2014-03-18 MED ORDER — PROPOFOL 10 MG/ML IV BOLUS
INTRAVENOUS | Status: DC | PRN
  Administered 2014-03-18: 8.7 mg via INTRAVENOUS

## 2014-03-18 MED ORDER — FENTANYL CITRATE 0.05 MG/ML IJ SOLN: 25.0000 ug | INTRAMUSCULAR | Status: DC | PRN

## 2014-03-18 MED ORDER — FENTANYL CITRATE 0.05 MG/ML IJ SOLN
25.0000 ug | INTRAMUSCULAR | Status: AC
Start: 2014-03-18 — End: 2014-03-18
  Administered 2014-03-18 (×2): 25 ug via INTRAVENOUS

## 2014-03-18 MED ORDER — ONDANSETRON HCL 4 MG/2ML IJ SOLN: 4.0000 mg | Freq: Once | INTRAMUSCULAR | Status: DC | PRN

## 2014-03-18 MED ORDER — ONDANSETRON HCL 4 MG/2ML IJ SOLN
INTRAMUSCULAR | Status: AC
  Filled 2014-03-18: qty 2

## 2014-03-18 MED ORDER — GLYCOPYRROLATE 0.2 MG/ML IJ SOLN
0.2000 mg | Freq: Once | INTRAMUSCULAR | Status: AC
  Administered 2014-03-18: 0.2 mg via INTRAVENOUS

## 2014-03-18 SURGICAL SUPPLY — 22 items
BLOCK BITE 60FR ADLT L/F BLUE (MISCELLANEOUS) ×2 IMPLANT
DEVICE CLIP HEMOSTAT 235CM (CLIP) IMPLANT
ELECT REM PT RETURN 9FT ADLT (ELECTROSURGICAL)
ELECTRODE REM PT RTRN 9FT ADLT (ELECTROSURGICAL) IMPLANT
FLOOR PAD 36X40 (MISCELLANEOUS) ×3
FORCEPS BIOP RAD 4 LRG CAP 4 (CUTTING FORCEPS) ×2 IMPLANT
FORMALIN 10 PREFIL 20ML (MISCELLANEOUS) ×4 IMPLANT
KIT CLEAN ENDO COMPLIANCE (KITS) ×2 IMPLANT
MANIFOLD NEPTUNE II (INSTRUMENTS) ×2 IMPLANT
NDL SCLEROTHERAPY 25GX240 (NEEDLE) IMPLANT
NEEDLE SCLEROTHERAPY 25GX240 (NEEDLE) IMPLANT
PAD FLOOR 36X40 (MISCELLANEOUS) IMPLANT
PROBE APC STR FIRE (PROBE) IMPLANT
PROBE INJECTION GOLD (MISCELLANEOUS)
PROBE INJECTION GOLD 7FR (MISCELLANEOUS) IMPLANT
SNARE ROTATE MED OVAL 20MM (MISCELLANEOUS) IMPLANT
SNARE SHORT THROW 13M SML OVAL (MISCELLANEOUS) ×3 IMPLANT
SYR 50ML LL SCALE MARK (SYRINGE) ×2 IMPLANT
SYR INFLATION 60ML (SYRINGE) ×3 IMPLANT
TUBING ENDO SMARTCAP PENTAX (MISCELLANEOUS) IMPLANT
TUBING IRRIGATION ENDOGATOR (MISCELLANEOUS) IMPLANT
WATER STERILE IRR 1000ML POUR (IV SOLUTION) ×2 IMPLANT

## 2014-03-18 NOTE — H&P (View-Only) (Signed)
Quick Note:  Mild elevation of AST. Platelet count slightly low. Labs show active Hepatitis C.  His last liver imaging was noncontrast, which limits study but no obvious tumor or cirrhosis.   Proceed with EGD. Based on findings, we will determine what further liver imaging he will need. We can also discuss possibility of HCV treatment at upcoming OV. Schedule a follow up OV in 6 weeks. ______

## 2014-03-18 NOTE — Op Note (Signed)
[facility] [address] [place], [id number]   ENDOSCOPY PROCEDURE REPORT  PATIENT: Jerome Henry, Jerome W.  MR#: [id number] BIRTHDATE: September 03, 1971 , 43  yrs. old GENDER: Male ENDOSCOPIST: R.  Roetta SessionsMichael Rourk, MD FACP FACG REFERRED BY:  Simone CuriaStephen Luking, M.D. PROCEDURE DATE:  03/18/2014 PROCEDURE:     EGD with Elease HashimotoMaloney dilation, esophageal and gastric  INDICATIONS:     esophageal dysphagia; refractory GERD  INFORMED CONSENT:   The risks, benefits, limitations, alternatives and imponderables have been discussed.  The potential for biopsy, esophogeal dilation, etc. have also been reviewed.  Questions have been answered.  All parties agreeable.  Please see the history and physical in the medical record for more information.  MEDICATIONS:    Deep sedation per Dr. [facility].  DESCRIPTION OF PROCEDURE:   The     endoscope was introduced through the mouth and advanced to the second portion of the duodenum without difficulty or limitations.  The mucosal surfaces were surveyed very carefully during advancement of the scope and upon withdrawal.  Retroflexion view of the proximal stomach and esophagogastric junction was performed.      FINDINGS: Patent tubular esophagus. Accentuated undulating Z line versus short segment Barrett's esophagus. Patient also had a couple at 3 mm "islands" of salmon -colored epithelium involving the distal esophagus. Stomach empty. 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 No ulcer or infiltrating process. No varices seen. Patent pylorus. Normal first and second portion of the duodenum  THERAPEUTIC / DIAGNOSTIC MANEUVERS PERFORMED:  A 56 French Maloney dilator was passed to full insertion easily. A look back revealed no apparent complication related to this maneuver. Subsequently, biopsies of the abnormal distal esophagus and stomach taken for histologic study.   COMPLICATIONS:  None  IMPRESSION:     Abnormal distal  esophagus-query Barrett's esophagus-status post biopsy following Maloney dilation. Abnormal gastric mucosa of uncertain significance-status post biopsy  RECOMMENDATIONS:  Stop Prilosec; begin Dexilant 60 mg daily. Followup on path allergy. Office visit with us in 4 weeks to reassess and deal with his other GI issues.    _______________________________ R. Roetta SessionsMichael Rourk, MD FACP [facility] eSigned:  R. Roetta SessionsMichael Rourk, MD FACP [facility] 03/18/2014 9:40 AM     CC:  PATIENT NAME:  Jerome Henry, Jerome W. MR#: [id number]

## 2014-03-18 NOTE — Anesthesia Preprocedure Evaluation (Addendum)
Anesthesia Evaluation  Patient identified by MRN, date of birth, ID band Patient awake    Reviewed: Allergy & Precautions, H&P , NPO status , Patient's Chart, lab work & pertinent test results  Airway Mallampati: II TM Distance: >3 FB Neck ROM: Full    Dental  (+) Edentulous Upper, Poor Dentition   Pulmonary shortness of breath and with exertion, COPDCurrent Smoker,          Cardiovascular hypertension, Pt. on medications Rhythm:Regular Rate:Normal     Neuro/Psych PSYCHIATRIC DISORDERS Anxiety    GI/Hepatic GERD-  Medicated and Poorly Controlled,(+) Hepatitis -, C  Endo/Other    Renal/GU      Musculoskeletal   Abdominal   Peds  Hematology   Anesthesia Other Findings   Reproductive/Obstetrics                          Anesthesia Physical Anesthesia Plan  ASA: III  Anesthesia Plan: MAC   Post-op Pain Management:    Induction: Intravenous  Airway Management Planned: Simple Face Mask  Additional Equipment:   Intra-op Plan:   Post-operative Plan:   Informed Consent: I have reviewed the patients History and Physical, chart, labs and discussed the procedure including the risks, benefits and alternatives for the proposed anesthesia with the patient or authorized representative who has indicated his/her understanding and acceptance.     Plan Discussed with:   Anesthesia Plan Comments:         Anesthesia Quick Evaluation

## 2014-03-18 NOTE — Anesthesia Procedure Notes (Signed)
Procedure Name: MAC Date/Time: 03/18/2014 9:04 AM Performed by: Franco NonesYATES, Danyael Alipio S Pre-anesthesia Checklist: Patient identified, Emergency Drugs available, Suction available, Timeout performed and Patient being monitored Patient Re-evaluated:Patient Re-evaluated prior to inductionOxygen Delivery Method: Non-rebreather mask

## 2014-03-18 NOTE — Interval H&P Note (Signed)
History and Physical Interval Note:  03/18/2014 9:00 AM  Jerome Henry  has presented today for surgery, with the diagnosis of GERD, LOSS OF WEIGHT, NAUSEA AND VOMITING, HEP C  The various methods of treatment have been discussed with the patient and family. After consideration of risks, benefits and other options for treatment, the patient has consented to  Procedure(s) with comments: ESOPHAGOGASTRODUODENOSCOPY (EGD) WITH PROPOFOL (N/A) - 10:15-moved to [address] notified pt MALONEY DILATION (N/A) as a surgical intervention .  The patient's history has been reviewed, patient examined, no change in status, stable for surgery.  I have reviewed the patient's chart and labs.  Questions were answered to the patient's satisfaction.      Daily GERD. Refractory to Prilosec daily. Intermittent esophageal dysphagia to solids. EGD with esophageal dilation as appropriate/feasible per plan.The risks, benefits, limitations, alternatives and imponderables have been reviewed with the patient. Potential for esophageal dilation, biopsy, etc. have also been reviewed.  Questions have been answered. All parties agreeable.   Jerome Henry

## 2014-03-18 NOTE — Transfer of Care (Signed)
Immediate Anesthesia Transfer of Care Note  Patient: Jerome Henry  Procedure(s) Performed: Procedure(s) (LRB): ESOPHAGOGASTRODUODENOSCOPY (EGD) WITH PROPOFOL (N/A) MALONEY DILATION 56 french (N/A) BIOPSY (N/A)  Patient Location: PACU  Anesthesia Type: MAC  Level of Consciousness: awake  Airway & Oxygen Therapy: Patient Spontanous Breathing. Nasal cannula  Post-op Assessment: Report given to PACU RN, Post -op Vital signs reviewed and stable and Patient moving all extremities  Post vital signs: Reviewed and stable  Complications: No apparent anesthesia complications

## 2014-03-18 NOTE — Telephone Encounter (Signed)
Pt is aware of OV on 6/17 at 1030 with LSL and appt card mailed

## 2014-03-18 NOTE — Anesthesia Postprocedure Evaluation (Signed)
  Anesthesia Post-op Note  Patient: Jerome Henry  Procedure(s) Performed: Procedure(s): ESOPHAGOGASTRODUODENOSCOPY (EGD) WITH PROPOFOL (N/A) MALONEY DILATION 56 french (N/A) BIOPSY (N/A)  Patient Location: PACU  Anesthesia Type:MAC  Level of Consciousness: awake and patient cooperative  Airway and Oxygen Therapy: Patient Spontanous Breathing and Patient connected to nasal cannula oxygen  Post-op Pain: mild  Post-op Assessment: Post-op Vital signs reviewed, Patient's Cardiovascular Status Stable, Respiratory Function Stable and No signs of Nausea or vomiting  Post-op Vital Signs: Reviewed and stable  Last Vitals:  Filed Vitals:   03/18/14 0938  BP: 132/74  Pulse: 82  Temp: 36.5 C  Resp: 20    Complications: No apparent anesthesia complications

## 2014-03-18 NOTE — Discharge Instructions (Signed)
EGD Discharge instructions Please read the instructions outlined below and refer to this sheet in the next few weeks. These discharge instructions provide you with general information on caring for yourself after you leave the hospital. Your doctor may also give you specific instructions. While your treatment has been planned according to the most current medical practices available, unavoidable complications occasionally occur. If you have any problems or questions after discharge, please call your doctor. ACTIVITY  You may resume your regular activity but move at a slower pace for the next 24 hours.   Take frequent rest periods for the next 24 hours.   Walking will help expel (get rid of) the air and reduce the bloated feeling in your abdomen.   No driving for 24 hours (because of the anesthesia (medicine) used during the test).   You may shower.   Do not sign any important legal documents or operate any machinery for 24 hours (because of the anesthesia used during the test).  NUTRITION  Drink plenty of fluids.   You may resume your normal diet.   Begin with a light meal and progress to your normal diet.   Avoid alcoholic beverages for 24 hours or as instructed by your caregiver.  MEDICATIONS  You may resume your normal medications unless your caregiver tells you otherwise.  WHAT YOU CAN EXPECT TODAY  You may experience abdominal discomfort such as a feeling of fullness or gas pains.  FOLLOW-UP  Your doctor will discuss the results of your test with you.  SEEK IMMEDIATE MEDICAL ATTENTION IF ANY OF THE FOLLOWING OCCUR:  Excessive nausea (feeling sick to your stomach) and/or vomiting.   Severe abdominal pain and distention (swelling).   Trouble swallowing.   Temperature over 101 F (37.8 C).   Rectal bleeding or vomiting of blood.    Stop Prilosec; begin Dexilant 60 mg daily-go by my office for a three-week supply of free samples  GERD information  provided  Further recommendations to follow pending review of pathology report  Office visit with us in 4 weeks

## 2014-03-19 ENCOUNTER — Encounter (HOSPITAL_COMMUNITY): Payer: Self-pay | Admitting: Internal Medicine

## 2014-03-20 ENCOUNTER — Encounter: Payer: Self-pay | Admitting: Internal Medicine

## 2014-03-23 ENCOUNTER — Telehealth: Payer: Self-pay | Admitting: *Deleted

## 2014-03-23 MED ORDER — AMOXICILL-CLARITHRO-OMEPRAZOLE 500-500-20 MG PO MISC: ORAL | Status: DC

## 2014-03-23 NOTE — Telephone Encounter (Signed)
[name] called for patient, per [name] there is no way pt can afford that medication, I made [name] aware that I spoke with Raynelle FanningJulie and per Raynelle FanningJulie we can try a generic, I told [name] if that is not cheaper to call us back. Please advise

## 2014-03-23 NOTE — Telephone Encounter (Signed)
Reviewed options on formulary with RMR- Omeclamox-pak on formulary as tier 4. Discount coupon available, pt shouldn't have to pay more than $15.00 for rx. Will send to pharmacy to see.

## 2014-03-23 NOTE — Telephone Encounter (Signed)
Theresa called for patient, per Theresa there is no way pt can afford that medication, I made Theresa aware that I spoke with Julie and per Julie we can try a generic, I told Theresa if that is not cheaper to call us back. Please advise  

## 2014-03-23 NOTE — Telephone Encounter (Signed)
Pt's wife is aware of new Rx at drug store. If they have any problems she will call us first thing in the morning.

## 2014-03-23 NOTE — Telephone Encounter (Signed)
Jerome Henry called back and stated that the Generic was 200.00 after insurance and they cant pay it please advise?

## 2014-04-08 ENCOUNTER — Ambulatory Visit (INDEPENDENT_AMBULATORY_CARE_PROVIDER_SITE_OTHER): Payer: BC Managed Care – PPO | Admitting: Family Medicine

## 2014-04-08 ENCOUNTER — Encounter: Payer: Self-pay | Admitting: Family Medicine

## 2014-04-08 VITALS — BP 122/88 | Ht 72.0 in | Wt 186.4 lb

## 2014-04-08 DIAGNOSIS — Z0000 Encounter for general adult medical examination without abnormal findings: Secondary | ICD-10-CM

## 2014-04-08 NOTE — Progress Notes (Signed)
   Subjective:    Patient ID: Jerome Henry, male    DOB: Mar 18, 1971, 43 y.o.   MRN: [id number]  HPI Patient is here today for his annual wellness exam. Patient states that he has no concerns at this time. Patient is doing very well.   [facility] on [facility]th.  Took round of abx for h pulori Has noted some discomfort with the reflux but improved  Smoking one ppd   No sig chronic cough with that  Getting a lot of exrcise at work  No fam hjx of family or prost cancer,  Results for orders placed in visit on 03/08/14  LIPID PANEL      Result Value Ref Range   Cholesterol 174  0 - 200 mg/dL   Triglycerides 510  <258 mg/dL   HDL 54  >52 mg/dL   Total CHOL/HDL Ratio 3.2     VLDL 20  0 - 40 mg/dL   LDL Cholesterol 778 (*) 0 - 99 mg/dL    Review of Systems  Constitutional: Negative for fever, activity change and appetite change.  HENT: Negative for congestion and rhinorrhea.   Eyes: Negative for discharge.  Respiratory: Negative for cough and wheezing.   Cardiovascular: Negative for chest pain.  Gastrointestinal: Negative for vomiting, abdominal pain and blood in stool.  Genitourinary: Negative for frequency and difficulty urinating.  Musculoskeletal: Negative for neck pain.  Skin: Negative for rash.  Allergic/Immunologic: Negative for environmental allergies and food allergies.  Neurological: Negative for weakness and headaches.  Psychiatric/Behavioral: Negative for agitation.  All other systems reviewed and are negative.      Objective:   Physical Exam  Vitals reviewed. Constitutional: He appears well-developed and well-nourished.  HENT:  Head: Normocephalic and atraumatic.  Right Ear: External ear normal.  Left Ear: External ear normal.  Nose: Nose normal.  Mouth/Throat: Oropharynx is clear and moist.  Eyes: EOM are normal. Pupils are equal, round, and reactive to light.  Neck: Normal range of motion. Neck supple. No thyromegaly present.    Cardiovascular: Normal rate, regular rhythm and normal heart sounds.   No murmur heard. Pulmonary/Chest: Effort normal and breath sounds normal. No respiratory distress. He has no wheezes.  Abdominal: Soft. Bowel sounds are normal. He exhibits no distension and no mass. There is no tenderness.  Genitourinary: Penis normal.  Musculoskeletal: Normal range of motion. He exhibits no edema.  Lymphadenopathy:    He has no cervical adenopathy.  Neurological: He is alert. He exhibits normal muscle tone.  Skin: Skin is warm and dry. No erythema.  Psychiatric: He has a normal mood and affect. His behavior is normal. Judgment normal.          Assessment & Plan:  Impression #1 wellness exam #2 chronic smoker discussed at length. #3 hepatitis C followed by specialist plan diet exercise discussed. Encouraged to stop smoking. Also encouraged to diminish and  stop alcohol ASAP. WSL

## 2014-04-28 ENCOUNTER — Ambulatory Visit (INDEPENDENT_AMBULATORY_CARE_PROVIDER_SITE_OTHER): Payer: BC Managed Care – PPO | Admitting: Gastroenterology

## 2014-04-28 ENCOUNTER — Encounter: Payer: Self-pay | Admitting: Gastroenterology

## 2014-04-28 VITALS — BP 144/96 | HR 128 | Temp 97.2°F | Resp 20 | Ht 74.0 in | Wt 182.0 lb

## 2014-04-28 DIAGNOSIS — F1010 Alcohol abuse, uncomplicated: Secondary | ICD-10-CM

## 2014-04-28 DIAGNOSIS — B1920 Unspecified viral hepatitis C without hepatic coma: Secondary | ICD-10-CM

## 2014-04-28 DIAGNOSIS — R634 Abnormal weight loss: Secondary | ICD-10-CM

## 2014-04-28 MED ORDER — THIAMINE HCL 100 MG PO TABS: 100.0000 mg | ORAL_TABLET | Freq: Every day | ORAL | Status: AC

## 2014-04-28 MED ORDER — CHLORDIAZEPOXIDE HCL 25 MG PO CAPS: ORAL_CAPSULE | ORAL | Status: DC

## 2014-04-28 NOTE — Progress Notes (Signed)
Primary Care Physician: Harlow AsaLUKING,W S, MD  Primary Gastroenterologist:  Roetta SessionsMichael Rourk, MD   Chief Complaint  Patient presents with  . Follow-up    HPI: Jerome Henry is a 43 y.o. male here for followup of recent EGD. EGD done for refractory GERD and esophageal dysphagia. He had an abnormal distal esophagus, biopsies were negative for Barrett's, status post dilation. Diffuse mosaicism of the gastric mucosa, biopsy consistent with H. pylori, he underwent treatment with Prevpac. Advised to take Dexilant, not omeprazole.   Patient initially seen back in April with chronic GERD. Complaint of frequent nausea and dry heaves every morning. Poor appetite. Reported 25 pound weight loss at that point, he has lost an additional 7 pounds since he was seen in April. History of hepatitis C diagnosed 14 years [facility].  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 Working long hours, worked the past 20 days without time off. Dad called him his work [employer]. Patient got mad and had tattoo "work [employer]" on his fingers.   He reports ongoing weight loss. Significant other of 10 years present today. She states he eats lunch every day and otherwise fills up on liquor and beer. Alcohol consumption has escalated. Patient reports no heartburn. Swallowing improved post EGD. Denies abdominal pain unless he mixes his alcohol. Denies melena or rectal bleeding. Generally has a daily bowel movement, sometimes diarrhea with increased alcohol consumption.     Vitals - 1 value per visit 04/28/2014 04/08/2014 03/18/2014 03/15/2014 03/11/2014  Weight (lb) 182 186.38  193 189  Height 6\' 2"  6\' 0"   6\' 0"  6\' 0"    Vitals - 1 value per visit 03/08/2014  Weight (lb) 193.25  Height 6\' 0"     Current Outpatient Prescriptions  Medication Sig Dispense Refill  . ALPRAZolam (XANAX) 1 MG tablet Take 1 tablet in the morning and 2 tablets qhs prn  90 tablet  5  . Melatonin 5  MG TABS Take 10 mg by mouth at bedtime.       Marland Kitchen. omeprazole (PRILOSEC) 20 MG capsule Take 1 capsule (20 mg total) by mouth daily.  30 capsule  2   No current facility-administered medications for this visit.    Allergies as of 04/28/2014 - Review Complete 04/28/2014  Allergen Reaction Noted  . Seroquel [quetiapine fumarate]  01/02/2014  . Zestoretic [lisinopril-hydrochlorothiazide]  01/02/2014    ROS:  General: see hpi ENT: Negative for hoarseness, difficulty swallowing , nasal congestion. CV: Negative for chest pain, angina, palpitations, dyspnea on exertion, peripheral edema.  Respiratory: Negative for dyspnea at rest, dyspnea on exertion, cough, sputum, wheezing.  GI: See history of present illness. GU:  Negative for dysuria, hematuria, urinary incontinence, urinary frequency, nocturnal urination.  Endo: see hpi   Physical Examination:   BP 144/96  Pulse 128  Temp(Src) 97.2 F (36.2 C) (Oral)  Resp 20  Ht 6\' 2"  (1.88 m)  Wt 182 lb (82.555 kg)  BMI 23.36 kg/m2  General: Well-nourished, well-developed in no acute distress. Accompanied by significant other. Eyes: No icterus. Mouth: Oropharyngeal mucosa moist and pink , no lesions erythema or exudate. Lungs: Clear to auscultation bilaterally.  Heart: Regular rate and rhythm, no murmurs rubs or gallops.  Abdomen: Bowel sounds are normal, nontender, nondistended, no hepatosplenomegaly or masses, no abdominal bruits or hernia , no rebound or guarding.   Extremities: No lower extremity edema. No clubbing or deformities. Neuro: Alert and oriented x 4  Skin: Warm and dry, no jaundice.   Psych: Alert and cooperative, normal mood and affect.  Labs:  Lab Results  Component Value Date   WBC 5.3 02/24/2014   HGB 15.8 02/24/2014   HCT 43.3 02/24/2014   MCV 93.5 02/24/2014   PLT 119* 02/24/2014   Lab Results  Component Value Date   CREATININE 0.85 02/24/2014   BUN 8 02/24/2014   NA 137 02/24/2014   K 3.9 02/24/2014   CL 102  02/24/2014   CO2 27 02/24/2014   Lab Results  Component Value Date   ALT 45 02/24/2014   AST 60* 02/24/2014   ALKPHOS 74 02/24/2014   BILITOT 0.8 02/24/2014   Lab Results  Component Value Date   LIPASE 25 02/24/2014    Imaging Studies: No results found.

## 2014-04-28 NOTE — Patient Instructions (Signed)
1. Start Librium the first day you don't drink alcohol. You will take 2 tablets (50mg ) every six hours. Days 2-5 you are not drinking alcohol, you will take 1 tablet (25mg ) every six hours. IF YOU NOTE SHAKES, TREMORS, SWEATING, VOMITING, CHANGE IN MENTAL STATUS GO TO THE ER. SOMETIMES PATIENTS HAVE TO BE IN THE HOSPITAL TO DETOX FOR FEW DAYS. 2. Take thiamine one tablet daily for 3 days while detox. Then take MVI EVERY DAY. 3. Take omeprazole once daily. 4. Return to the office on Monday.  Alcohol Withdrawal Anytime drug use is interfering with normal living activities it has become abuse. This includes problems with family and friends. Psychological dependence has developed when your mind tells you that the drug is needed. This is usually followed by physical dependence when a continuing increase of drugs are required to get the same feeling or "high." This is known as addiction or chemical dependency. A person's risk is much higher if there is a history of chemical dependency in the family. Mild Withdrawal Following Stopping Alcohol, When Addiction or Chemical Dependency Has Developed When a person has developed tolerance to alcohol, any sudden stopping of alcohol can cause uncomfortable physical symptoms. Most of the time these are mild and consist of tremors in the hands and increases in heart rate, breathing, and temperature. Sometimes these symptoms are associated with anxiety, panic attacks, and bad dreams. There may also be stomach upset. Normal sleep patterns are often interrupted with periods of inability to sleep (insomnia). This may last for 6 months. Because of this discomfort, many people choose to continue drinking to get rid of this discomfort and to try to feel normal. Severe Withdrawal with Decreased or No Alcohol Intake, When Addiction or Chemical Dependency Has Developed About five percent of alcoholics will develop signs of severe withdrawal when they stop using alcohol. One sign of  this is development of generalized seizures (convulsions). Other signs of this are severe agitation and confusion. This may be associated with believing in things which are not real or seeing things which are not really there (delusions and hallucinations). Vitamin deficiencies are usually present if alcohol intake has been long-term. Treatment for this most often requires hospitalization and close observation. Addiction can only be helped by stopping use of all chemicals. This is hard but may save your life. With continual alcohol use, possible outcomes are usually loss of self respect and esteem, violence, and death. Addiction cannot be cured but it can be stopped. This often requires outside help and the care of professionals. Treatment centers are listed in the yellow pages under Cocaine, Narcotics, and Alcoholics Anonymous. Most hospitals and clinics can refer you to a specialized care center. It is not necessary for you to go through the uncomfortable symptoms of withdrawal. Your caregiver can provide you with medicines that will help you through this difficult period. Try to avoid situations, friends, or drugs that made it possible for you to keep using alcohol in the past. Learn how to say no. It takes a long period of time to overcome addictions to all drugs, including alcohol. There may be many times when you feel as though you want a drink. After getting rid of the physical addiction and withdrawal, you will have a lessening of the craving which tells you that you need alcohol to feel normal. Call your caregiver if more support is needed. Learn who to talk to in your family and among your friends so that during these periods you can receive  outside help. Alcoholics Anonymous (AA) has helped many people over the years. To get further help, contact AA or call your caregiver, counselor, or clergyperson. Al-Anon and Alateen are support groups for friends and family members of an alcoholic. The people who  love and care for an alcoholic often need help, too. For information about these organizations, check your phone directory or call a local alcoholism treatment center.  SEEK IMMEDIATE MEDICAL CARE IF:   You have a seizure.  You have a fever.  You experience uncontrolled vomiting or you vomit up blood. This may be bright red or look like black coffee grounds.  You have blood in the stool. This may be bright red or appear as a black, tarry, bad-smelling stool.  You become lightheaded or faint. Do not drive if you feel this way. Have someone else drive you or call [id number] for help.  You become more agitated or confused.  You develop uncontrolled anxiety.  You begin to see things that are not really there (hallucinate). Your caregiver has determined that you completely understand your medical condition, and that your mental state is back to normal. You understand that you have been treated for alcohol withdrawal, have agreed not to drink any alcohol for a minimum of 1 day, will not operate a car or other machinery for 24 hours, and have had an opportunity to ask any questions about your condition. Document Released: 08/08/2005 Document Revised: 01/21/2012 Document Reviewed: 06/16/2008 [facility] Patient Information 2015 [place], [place]. This information is not intended to replace advice given to you by your health care provider. Make sure you discuss any questions you have with your health care provider.

## 2014-04-30 NOTE — Assessment & Plan Note (Signed)
Ongoing weight loss likely due to inadequate caloric consumption. Patient excessive drinking etoh. Skips meals frequently. He reports he is ready to quit. He states rehab never helped him. He quick drugs years ago on his own. He denies history of seizures. He has quit drinking for a week earlier in the spring without evidence of DTs. Spent 30 minutes discussing options with patient. He is not interested in inpatient or outpatient rehab. Offered trial of Librium. Advised against taking medication together. His goal is to start Saturday. We will follow up with him in office on Tuesday. Written instructions provided. Alarm symptoms discussed at length.   With regards to chronic HCV, would like to consider treatment in the near future, once etoh abuse addressed.

## 2014-05-03 NOTE — Progress Notes (Signed)
cc'd to pcp 

## 2014-05-04 ENCOUNTER — Encounter: Payer: Self-pay | Admitting: Gastroenterology

## 2014-05-04 ENCOUNTER — Ambulatory Visit (INDEPENDENT_AMBULATORY_CARE_PROVIDER_SITE_OTHER): Payer: BC Managed Care – PPO | Admitting: Gastroenterology

## 2014-05-04 VITALS — BP 116/86 | HR 86 | Temp 97.7°F | Resp 20 | Ht 74.0 in | Wt 186.0 lb

## 2014-05-04 DIAGNOSIS — F1010 Alcohol abuse, uncomplicated: Secondary | ICD-10-CM

## 2014-05-04 NOTE — Patient Instructions (Signed)
1. Continue Librium. 2. Call Thursday and let me know how you are doing. We will discuss further Librium use at that time. 3. Take Thiamine 100mg  daily for 3 days.  4. Continue MVI daily.

## 2014-05-04 NOTE — Assessment & Plan Note (Signed)
Decided to quit alcohol. Close to 48 hours without alcohol at this point. Have discussed at length potential symptoms of alcohol withdrawal. Advised to use Librium taper as previously outlined. Advised him to take thiamine 100 mg daily for the next several days and then followup with multivitamin daily. He reports that he is committed to alcohol abstinence. They will call on Thursday with a progress report. Further recommendations to follow. However in the meantime they've been well versed on alcohol withdrawal symptoms and need for ER evaluation if necessary.

## 2014-05-04 NOTE — Progress Notes (Signed)
      Primary Care Physician: Harlow AsaLUKING,W S, MD  Primary Gastroenterologist:  Roetta SessionsMichael Rourk, MD   Chief Complaint  Patient presents with  . Office Visit    HPI: Jerome Henry is a 43 y.o. male here for f/u. Seen last week for , anorexia, weight loss, chronic hepatitis C, alcohol abuse. He wanted to stop drinking and requested assistance at his last office visit. Started Librium yesterday, no alcohol in 36-48 hours. Doing well. Denies any tremors, shakes, nausea, palpitations. He worked today and yesterday he therefore decided to take only one Librium work. States he "did okay". Denies any other specific complaints.  Current Outpatient Prescriptions  Medication Sig Dispense Refill  . ALPRAZolam (XANAX) 1 MG tablet Take 1 tablet in the morning and 2 tablets qhs prn  90 tablet  5  . chlordiazePOXIDE (LIBRIUM) 25 MG capsule Take 2 every six hours on day 1. Take 1 every six hours on day 2-5.  25 capsule  0  . Melatonin 5 MG TABS Take 10 mg by mouth at bedtime.       Marland Kitchen. omeprazole (PRILOSEC) 20 MG capsule Take 1 capsule (20 mg total) by mouth daily.  30 capsule  2  . thiamine 100 MG tablet Take 1 tablet (100 mg total) by mouth daily.  3 tablet  0   No current facility-administered medications for this visit.    Allergies as of 05/04/2014 - Review Complete 05/04/2014  Allergen Reaction Noted  . Seroquel [quetiapine fumarate]  01/02/2014  . Zestoretic [lisinopril-hydrochlorothiazide]  01/02/2014    ROS:  General: Negative for anorexia, weight loss, fever, chills, fatigue, weakness. ENT: Negative for hoarseness, difficulty swallowing , nasal congestion. CV: Negative for chest pain, angina, palpitations, dyspnea on exertion, peripheral edema.  Respiratory: Negative for dyspnea at rest, dyspnea on exertion, cough, sputum, wheezing.  GI: See history of present illness. GU:  Negative for dysuria, hematuria, urinary incontinence, urinary frequency, nocturnal urination.  Endo: Negative for  unusual weight change.    Physical Examination:   BP 116/86  Pulse 86  Temp(Src) 97.7 F (36.5 C) (Oral)  Resp 20  Ht 6\' 2"  (1.88 m)  Wt 186 lb (84.369 kg)  BMI 23.87 kg/m2  General: Well-nourished, well-developed in no acute distress. Accompanied by significant other. Eyes: No icterus. Mouth: Oropharyngeal mucosa moist and pink , no lesions erythema or exudate. Lungs: Clear to auscultation bilaterally.  Heart: Regular rate and rhythm, no murmurs rubs or gallops.  Abdomen: Bowel sounds are normal, nontender, nondistended, no hepatosplenomegaly or masses, no abdominal bruits or hernia , no rebound or guarding.   Extremities: No lower extremity edema. No clubbing or deformities. Neuro: Alert and oriented x 4. No tremors.   Skin: Warm and dry, no jaundice.   Psych: Alert and cooperative, normal mood and affect.

## 2014-05-05 NOTE — Progress Notes (Signed)
cc'd to pcp 

## 2014-05-06 ENCOUNTER — Telehealth: Payer: Self-pay | Admitting: *Deleted

## 2014-05-06 MED ORDER — CHLORDIAZEPOXIDE HCL 25 MG PO CAPS: ORAL_CAPSULE | ORAL | Status: DC

## 2014-05-06 NOTE — Telephone Encounter (Signed)
Spoke with wife. Patient doing well. No etoh in four days. A little anxious yesterday, but much better today. Advised continue Librium with goal of taper after three more days. RX sent.  Notified her that I will be out of town for one week. Will update Gerrit HallsAnna Sams, NP in case they need assistance next week.

## 2014-05-06 NOTE — Telephone Encounter (Signed)
[name] called for pt stating she needs to speak with [name] to let her know how mike it doing. Please advise [phone] (see other notes)

## 2014-09-21 ENCOUNTER — Other Ambulatory Visit: Payer: Self-pay | Admitting: Family Medicine

## 2014-09-22 NOTE — Telephone Encounter (Signed)
Last seen 04/08/14

## 2014-09-22 NOTE — Telephone Encounter (Signed)
One mo only must have ov before further

## 2014-09-29 ENCOUNTER — Other Ambulatory Visit: Payer: Self-pay | Admitting: Family Medicine

## 2014-09-29 ENCOUNTER — Telehealth: Payer: Self-pay | Admitting: Family Medicine

## 2014-09-29 NOTE — Telephone Encounter (Signed)
Notified [name] I called in the medication on pharmacy's voicemail.

## 2014-09-29 NOTE — Telephone Encounter (Signed)
no

## 2014-09-29 NOTE — Telephone Encounter (Signed)
See my last answer on this just called in one wk ago so no

## 2014-09-29 NOTE — Telephone Encounter (Signed)
[facility] [place] that we did not send the alprazolam on 09/22/14.  Can we resend this to them?

## 2014-09-29 NOTE — Telephone Encounter (Signed)
Was last prescribed 09/22/14 and said use sparingly and needs office visit Last seen 03/2014

## 2014-11-14 ENCOUNTER — Other Ambulatory Visit: Payer: Self-pay | Admitting: Family Medicine

## 2014-11-15 ENCOUNTER — Other Ambulatory Visit: Payer: Self-pay | Admitting: Family Medicine

## 2014-11-15 NOTE — Telephone Encounter (Signed)
Ok plus two ref monthly

## 2014-11-16 NOTE — Telephone Encounter (Signed)
Ok plus 2 ref 

## 2014-11-17 ENCOUNTER — Other Ambulatory Visit: Payer: Self-pay | Admitting: Family Medicine

## 2014-12-10 ENCOUNTER — Encounter: Payer: Self-pay | Admitting: Family Medicine

## 2014-12-10 ENCOUNTER — Ambulatory Visit (INDEPENDENT_AMBULATORY_CARE_PROVIDER_SITE_OTHER): Payer: Self-pay | Admitting: Family Medicine

## 2014-12-10 VITALS — BP 122/70 | Temp 98.3°F | Ht 72.0 in | Wt 199.0 lb

## 2014-12-10 DIAGNOSIS — L300 Nummular dermatitis: Secondary | ICD-10-CM | POA: Insufficient documentation

## 2014-12-10 MED ORDER — MOMETASONE FUROATE 0.1 % EX CREA: TOPICAL_CREAM | CUTANEOUS | Status: AC

## 2014-12-10 NOTE — Progress Notes (Signed)
   Subjective:    Patient ID: Jerome Henry, male    DOB: 06/16/1971, 44 y.o.   MRN: [id number]  Rash This is a new problem. The current episode started more than 1 month ago. The affected locations include the right foot. The rash is characterized by itchiness.   This patient start having a small rash is gotten larger he's tried multiple antifungal creams over-the-counter as well as sprays without getting any assistance of it he is frustrated by how it is doing. It itches a lot.   Review of Systems  Skin: Positive for rash.       Objective:   Physical Exam  Has you parents and nummular eczema on the right foot. There is no sign of any type of fungal aspect to this. No sign of cellulitis.      Assessment & Plan:  Nummular eczema use Elocon cream apply once to twice daily I told the patient and this will not cure her ill just help keep it under control if he gets worse over the next few weeks let us know we can set amount with dermatology  I did not feel that this was a yeast or fungus infection because of the appearance and the lack of response to multiple over-the-counter antifungal creams plus antifungal creams his girlfriend gave him.  Hepatitis C-I told the patient he truly should consider getting this checked he ought to consider going ahead and [facility]. He will think about it let us now.

## 2015-03-02 ENCOUNTER — Other Ambulatory Visit: Payer: Self-pay | Admitting: Family Medicine

## 2015-03-04 ENCOUNTER — Telehealth: Payer: Self-pay | Admitting: Family Medicine

## 2015-03-04 MED ORDER — ALPRAZOLAM 1 MG PO TABS: ORAL_TABLET | ORAL | Status: DC

## 2015-03-04 NOTE — Telephone Encounter (Signed)
May have 45 tablets. Needs follow-up office visit with Dr. Brett CanalesSteve to discuss this medication and document ongoing management.

## 2015-03-04 NOTE — Telephone Encounter (Signed)
Pt is requesting a refill on his xanax.  [facility] [place]

## 2015-03-08 ENCOUNTER — Other Ambulatory Visit: Payer: Self-pay | Admitting: *Deleted

## 2015-03-28 ENCOUNTER — Ambulatory Visit (INDEPENDENT_AMBULATORY_CARE_PROVIDER_SITE_OTHER): Payer: Self-pay | Admitting: Family Medicine

## 2015-03-28 ENCOUNTER — Encounter: Payer: Self-pay | Admitting: Family Medicine

## 2015-03-28 VITALS — Ht 72.0 in | Wt 198.0 lb

## 2015-03-28 DIAGNOSIS — I10 Essential (primary) hypertension: Secondary | ICD-10-CM

## 2015-03-28 MED ORDER — ALPRAZOLAM 1 MG PO TABS: ORAL_TABLET | ORAL | Status: DC

## 2015-03-28 MED ORDER — ENALAPRIL MALEATE 10 MG PO TABS: 10.0000 mg | ORAL_TABLET | Freq: Every day | ORAL | Status: AC

## 2015-03-28 MED ORDER — ALPRAZOLAM 1 MG PO TABS: ORAL_TABLET | ORAL | Status: AC

## 2015-03-28 MED ORDER — ETODOLAC 400 MG PO TABS: 400.0000 mg | ORAL_TABLET | Freq: Two times a day (BID) | ORAL | Status: AC | PRN

## 2015-03-28 NOTE — Progress Notes (Signed)
   Subjective:    Patient ID: Jerome Henry, male    DOB: [birth year], 44 y.o.   MRN: [id number]  HPI Patient arrives for a follow up on anxiety and xanax. Patient states he would like to discuss starting on pain medication to help with his chronic arthritis pain.  Exercise on and off mostly  Still drinking but less, beer only  Still has hep C. Was unable to get the regimen last year when on good insurance.  Uses ibuprofen advil goodies an d etc.    Hx of chronic pain in the back and in the hands  Uses otc prilosec prn, now better off liquor   Was on blood pressure medicine in the past. Diuretics. Cause unsatisfactory side effects.        Review of Systems No headache no chest pain no abdominal pain chronic low back pain. Chronic hand and joint pain.    Objective:   Physical Exam  Alert vitals stable blood pressure 156/100 on repeat both arms lungs clear heart rare rhythm hands significant Heberden's nodes      Assessment & Plan:  Impression 1 hypertension suboptimal #2 chronic anxiety significant and profound patient states definitely needs medications. #3 chronic smoker encouraged to stop #4 chronic alcohol use encouraged to quit #5 hepatitis C plan add generic enalapril 10 mg daily at bedtime. Advised patient we will not prescribe narcotics for arthritis pain. Xanax refilled for 6 months worth. Follow-up then. WSL

## 2015-09-13 DEATH — deceased
